# Patient Record
Sex: Female | Born: 1960 | Race: Black or African American | Hispanic: No | Marital: Married | Smoking: Current every day smoker
Health system: Southern US, Community
[De-identification: ages and names within clinical notes are randomized; demographics above are authoritative.]

## PROBLEM LIST (undated history)

## (undated) DIAGNOSIS — J449 Chronic obstructive pulmonary disease, unspecified: Secondary | ICD-10-CM

## (undated) DIAGNOSIS — I639 Cerebral infarction, unspecified: Secondary | ICD-10-CM

## (undated) DIAGNOSIS — E876 Hypokalemia: Secondary | ICD-10-CM

## (undated) DIAGNOSIS — I10 Essential (primary) hypertension: Secondary | ICD-10-CM

## (undated) HISTORY — PX: ABDOMINAL SURGERY: SHX537

## (undated) HISTORY — PX: TONSILLECTOMY: SUR1361

---

## 2011-03-23 ENCOUNTER — Emergency Department (INDEPENDENT_AMBULATORY_CARE_PROVIDER_SITE_OTHER): Payer: BC Managed Care – PPO

## 2011-03-23 ENCOUNTER — Encounter: Payer: Self-pay | Admitting: *Deleted

## 2011-03-23 ENCOUNTER — Emergency Department (HOSPITAL_BASED_OUTPATIENT_CLINIC_OR_DEPARTMENT_OTHER)
Admission: EM | Admit: 2011-03-23 | Discharge: 2011-03-24 | Disposition: A | Discharge status: Other Acute Inpatient Hospital | Payer: BC Managed Care – PPO | Source: Home / Self Care | Attending: Emergency Medicine | Admitting: Emergency Medicine

## 2011-03-23 ENCOUNTER — Other Ambulatory Visit: Payer: Self-pay

## 2011-03-23 DIAGNOSIS — J984 Other disorders of lung: Secondary | ICD-10-CM | POA: Insufficient documentation

## 2011-03-23 DIAGNOSIS — R21 Rash and other nonspecific skin eruption: Secondary | ICD-10-CM

## 2011-03-23 DIAGNOSIS — R1110 Vomiting, unspecified: Secondary | ICD-10-CM | POA: Insufficient documentation

## 2011-03-23 DIAGNOSIS — R911 Solitary pulmonary nodule: Secondary | ICD-10-CM

## 2011-03-23 DIAGNOSIS — E876 Hypokalemia: Secondary | ICD-10-CM

## 2011-03-23 DIAGNOSIS — K2970 Gastritis, unspecified, without bleeding: Secondary | ICD-10-CM | POA: Diagnosis present

## 2011-03-23 DIAGNOSIS — F17200 Nicotine dependence, unspecified, uncomplicated: Secondary | ICD-10-CM | POA: Diagnosis present

## 2011-03-23 DIAGNOSIS — R55 Syncope and collapse: Secondary | ICD-10-CM | POA: Insufficient documentation

## 2011-03-23 DIAGNOSIS — K922 Gastrointestinal hemorrhage, unspecified: Principal | ICD-10-CM | POA: Diagnosis present

## 2011-03-23 DIAGNOSIS — R197 Diarrhea, unspecified: Secondary | ICD-10-CM | POA: Insufficient documentation

## 2011-03-23 DIAGNOSIS — E8809 Other disorders of plasma-protein metabolism, not elsewhere classified: Secondary | ICD-10-CM | POA: Diagnosis present

## 2011-03-23 DIAGNOSIS — M542 Cervicalgia: Secondary | ICD-10-CM | POA: Insufficient documentation

## 2011-03-23 DIAGNOSIS — D62 Acute posthemorrhagic anemia: Secondary | ICD-10-CM | POA: Diagnosis present

## 2011-03-23 DIAGNOSIS — M79609 Pain in unspecified limb: Secondary | ICD-10-CM | POA: Insufficient documentation

## 2011-03-23 DIAGNOSIS — R209 Unspecified disturbances of skin sensation: Secondary | ICD-10-CM

## 2011-03-23 DIAGNOSIS — Z791 Long term (current) use of non-steroidal anti-inflammatories (NSAID): Secondary | ICD-10-CM

## 2011-03-23 DIAGNOSIS — R112 Nausea with vomiting, unspecified: Secondary | ICD-10-CM | POA: Diagnosis present

## 2011-03-23 DIAGNOSIS — R079 Chest pain, unspecified: Secondary | ICD-10-CM | POA: Diagnosis present

## 2011-03-23 HISTORY — DX: Hypokalemia: E87.6

## 2011-03-23 LAB — CBC
Platelets: 358 10*3/uL (ref 150–400)
RBC: 3.66 MIL/uL — ABNORMAL LOW (ref 3.87–5.11)
RDW: 20.2 % — ABNORMAL HIGH (ref 11.5–15.5)
WBC: 9.3 10*3/uL (ref 4.0–10.5)

## 2011-03-23 LAB — CARDIAC PANEL(CRET KIN+CKTOT+MB+TROPI)
CK, MB: 2.6 ng/mL (ref 0.3–4.0)
Relative Index: 2.1 (ref 0.0–2.5)
Troponin I: <0.3 ng/mL (ref <0.30)

## 2011-03-23 LAB — DIFFERENTIAL
Basophils Absolute: 0 10*3/uL (ref 0.0–0.1)
Lymphocytes Relative: 25 % (ref 12–46)
Lymphs Abs: 2.3 10*3/uL (ref 0.7–4.0)
Neutro Abs: 6.1 10*3/uL (ref 1.7–7.7)

## 2011-03-23 LAB — BASIC METABOLIC PANEL
CO2: 41 mEq/L (ref 19–32)
CO2: 40 mEq/L (ref 19–32)
Chloride: 85 mEq/L — ABNORMAL LOW (ref 96–112)
Chloride: 88 mEq/L — ABNORMAL LOW (ref 96–112)
Glucose, Bld: 115 mg/dL — ABNORMAL HIGH (ref 70–99)
Potassium: 1.9 mEq/L — CL (ref 3.5–5.1)
Potassium: 2 mEq/L — CL (ref 3.5–5.1)
Sodium: 137 mEq/L (ref 135–145)
Sodium: 138 mEq/L (ref 135–145)

## 2011-03-23 LAB — MAGNESIUM: Magnesium: 1.8 mg/dL (ref 1.5–2.5)

## 2011-03-23 LAB — POTASSIUM: Potassium: 2.1 mEq/L — CL (ref 3.5–5.1)

## 2011-03-23 MED ORDER — POTASSIUM CHLORIDE CRYS ER 20 MEQ PO TBCR
40.0000 meq | EXTENDED_RELEASE_TABLET | Freq: Once | ORAL | Status: AC
  Administered 2011-03-23: 40 meq via ORAL
  Filled 2011-03-23: qty 2

## 2011-03-23 MED ORDER — ONDANSETRON HCL 4 MG/2ML IJ SOLN
INTRAMUSCULAR | Status: AC
  Administered 2011-03-23: 4 mg via INTRAVENOUS
  Filled 2011-03-23: qty 2

## 2011-03-23 MED ORDER — ONDANSETRON HCL 4 MG/2ML IJ SOLN: 4.0000 mg | Freq: Once | INTRAMUSCULAR | Status: DC

## 2011-03-23 MED ORDER — ONDANSETRON HCL 4 MG/2ML IJ SOLN
4.0000 mg | Freq: Once | INTRAMUSCULAR | Status: AC
  Administered 2011-03-23: 4 mg via INTRAVENOUS

## 2011-03-23 MED ORDER — POTASSIUM CHLORIDE 10 MEQ/100ML IV SOLN
10.0000 meq | INTRAVENOUS | Status: AC
  Administered 2011-03-23 (×2): 10 meq via INTRAVENOUS
  Filled 2011-03-23 (×2): qty 100

## 2011-03-23 MED ORDER — SODIUM CHLORIDE 0.9 % IV BOLUS (SEPSIS)
1000.0000 mL | Freq: Once | INTRAVENOUS | Status: AC
  Administered 2011-03-23: 1000 mL via INTRAVENOUS

## 2011-03-23 NOTE — ED Notes (Signed)
Pt c/o rash under bil breast and bil legs pain x 3 weeks. Pt states she had a syncopal episode sat while standing up from chair.

## 2011-03-23 NOTE — ED Notes (Signed)
rec'd call from lab with critical lab results, PA made aware and new orders rec'd.

## 2011-03-23 NOTE — ED Notes (Signed)
Pt placed on bedpan per request.

## 2011-03-23 NOTE — ED Provider Notes (Addendum)
History     CSN: [id number] Arrival date & time: 03/23/2011  6:07 PM   Chief Complaint  Patient presents with  . Leg Pain  . Rash     (Include location/radiation/quality/duration/timing/severity/associated sxs/prior treatment) Patient is a 50 y.o. female presenting with leg pain, rash, and syncope. The history is provided by the patient. No language interpreter was used.  Leg Pain  The incident occurred more than 1 week ago. The incident occurred at home. There was no injury mechanism. The pain location is generalized. The quality of the pain is described as aching. The pain has been constant since onset. Pertinent negatives include no numbness, no muscle weakness, no loss of sensation and no tingling. She reports no foreign bodies present. The symptoms are aggravated by nothing.  Rash  This is a new problem. The current episode started more than 2 days ago. The problem has not changed since onset.The problem is associated with nothing. There has been no fever. The rash is present on the torso. The patient is experiencing no pain. The pain has been constant since onset. She has tried steriods for the symptoms. The treatment provided no relief.  Loss of Consciousness This is a new problem. The current episode started in the past 7 days. Episode frequency: 2 times 3 days ago. Associated symptoms include coughing, nausea, a rash and vomiting. Pertinent negatives include no change in bowel habit, fever, headaches or numbness. The symptoms are aggravated by standing. She has tried nothing for the symptoms.     Past Medical History  Diagnosis Date  . Hypokalemia      Past Surgical History  Procedure Date  . Abdominal surgery   . Tonsillectomy     History reviewed. No pertinent family history.  History  Substance Use Topics  . Smoking status: Current Everyday Smoker -- 1.0 packs/day  . Smokeless tobacco: Not on file  . Alcohol Use: No    OB History    Grav Para Term Preterm  Abortions TAB SAB Ect Mult Living                  Review of Systems  Constitutional: Negative for fever.  Respiratory: Positive for cough.   Cardiovascular: Positive for syncope.  Gastrointestinal: Positive for nausea and vomiting. Negative for change in bowel habit.  Skin: Positive for rash.  Neurological: Negative for tingling, numbness and headaches.  All other systems reviewed and are negative.    Allergies  Aspirin  Home Medications   Current Outpatient Rx  Name Route Sig Dispense Refill  . CETIRIZINE HCL 10 MG PO TABS Oral Take 10 mg by mouth daily as needed.      Marland Kitchen NAPROXEN SODIUM 220 MG PO TABS Oral Take 220 mg by mouth 2 (two) times daily with a meal. pain       Physical Exam    BP 117/88  Pulse 128  Temp 98.4 F (36.9 C)  Resp 18  Ht 5\' 10"  (1.778 m)  Wt 210 lb (95.255 kg)  BMI 30.13 kg/m2  SpO2 100%  Physical Exam  Nursing note and vitals reviewed. Constitutional: She is oriented to person, place, and time. She appears well-developed and well-nourished.  HENT:  Head: Atraumatic.  Eyes: Pupils are equal, round, and reactive to light.  Neck: Normal range of motion.  Cardiovascular: Normal rate and regular rhythm.   Pulmonary/Chest: Effort normal and breath sounds normal.  Abdominal: Soft. Bowel sounds are normal.  Musculoskeletal: Normal range of motion.  Neurological: She  is oriented to person, place, and time.  Skin:       Pt has redness under bilateral breast  Psychiatric: She has a normal mood and affect.    ED Course  CRITICAL CARE Performed by: Teressa Lower Authorized by: Pollyann Savoy Total critical care time: 30 minutes Critical care was necessary to treat or prevent imminent or life-threatening deterioration of the following conditions: cardiac failure. Critical care was time spent personally by me on the following activities: re-evaluation of patient's condition, examination of patient and discussions with  consultants.    Results for orders placed during the hospital encounter of 03/23/11  CBC      Component Value Range   WBC 9.3  4.0 - 10.5 (K/uL)   RBC 3.66 (*) 3.87 - 5.11 (MIL/uL)   Hemoglobin 11.1 (*) 12.0 - 15.0 (g/dL)   HCT 40.9 (*) 81.1 - 46.0 (%)   MCV 90.4  78.0 - 100.0 (fL)   MCH 30.3  26.0 - 34.0 (pg)   MCHC 33.5  30.0 - 36.0 (g/dL)   RDW 91.4 (*) 78.2 - 15.5 (%)   Platelets 358  150 - 400 (K/uL)  DIFFERENTIAL      Component Value Range   Neutrophils Relative 66  43 - 77 (%)   Neutro Abs 6.1  1.7 - 7.7 (K/uL)   Lymphocytes Relative 25  12 - 46 (%)   Lymphs Abs 2.3  0.7 - 4.0 (K/uL)   Monocytes Relative 9  3 - 12 (%)   Monocytes Absolute 0.8  0.1 - 1.0 (K/uL)   Eosinophils Relative 1  0 - 5 (%)   Eosinophils Absolute 0.1  0.0 - 0.7 (K/uL)   Basophils Relative 0  0 - 1 (%)   Basophils Absolute 0.0  0.0 - 0.1 (K/uL)  BASIC METABOLIC PANEL      Component Value Range   Sodium 137  135 - 145 (mEq/L)   Potassium 1.9 (*) 3.5 - 5.1 (mEq/L)   Chloride 85 (*) 96 - 112 (mEq/L)   CO2 41 (*) 19 - 32 (mEq/L)   Glucose, Bld 120 (*) 70 - 99 (mg/dL)   BUN 2 (*) 6 - 23 (mg/dL)   Creatinine, Ser 9.56  0.50 - 1.10 (mg/dL)   Calcium 8.8  8.4 - 21.3 (mg/dL)   GFR calc non Af Amer >60  >60 (mL/min)   GFR calc Af Amer >60  >60 (mL/min)  BASIC METABOLIC PANEL      Component Value Range   Sodium 138  135 - 145 (mEq/L)   Potassium 2.0 (*) 3.5 - 5.1 (mEq/L)   Chloride 88 (*) 96 - 112 (mEq/L)   CO2 40 (*) 19 - 32 (mEq/L)   Glucose, Bld 115 (*) 70 - 99 (mg/dL)   BUN 2 (*) 6 - 23 (mg/dL)   Creatinine, Ser <0.86 (*) 0.50 - 1.10 (mg/dL)   Calcium 8.1 (*) 8.4 - 10.5 (mg/dL)   GFR calc non Af Amer NOT CALCULATED  >60 (mL/min)   GFR calc Af Amer NOT CALCULATED  >60 (mL/min)  CARDIAC PANEL(CRET KIN+CKTOT+MB+TROPI)      Component Value Range   Total CK 126  7 - 177 (U/L)   CK, MB 2.6  0.3 - 4.0 (ng/mL)   Troponin I <0.30  <0.30 (ng/mL)   Relative Index 2.1  0.0 - 2.5    Dg Chest 2  View  03/23/2011  *RADIOLOGY REPORT*  Clinical Data: Neck pain and numbness.  CHEST - 2 VIEW  Comparison: None.  Findings: Right lung is clear.  Small 5 mm nodular densities seen in the left midlung.  This could be a confluence of shadows, but pulmonary nodule cannot be excluded. The cardiopericardial silhouette is within normal limits for size. Imaged bony structures of the thorax are intact.  IMPRESSION: Question 5 mm left lung nodule.  CT chest without contrast is recommended to further evaluate.  Original Report Authenticated By: ERIC A. MANSELL, M.D.     Date: 03/23/2011  Rate: 97  Rhythm: normal sinus rhythm  QRS Axis: normal  Intervals: normal  ST/T Wave abnormalities: nonspecific T wave changes  Conduction Disutrbances:none  Narrative Interpretation:   Old EKG Reviewed: none available   MDM Spoke with Dr. Kaylyn Layer and he is requesting 80 meq po of potassium and 2 runs and then a recheck prior to transport:pt is monitored at this time       Teressa Lower, NP 03/23/11 2129  Teressa Lower, NP 03/23/11 2133

## 2011-03-23 NOTE — ED Notes (Signed)
MD Bernette Mayers made aware of lab results, no new orders rec'd. SR on monitor, pt denies CP. Pt had episode of loose stool, peri-care provided, pt tolerated well.

## 2011-03-23 NOTE — ED Provider Notes (Signed)
Medical screening examination/treatment/procedure(s) were performed by non-physician practitioner and as supervising physician I was immediately available for consultation/collaboration.   Saliah Crisp B. Bernette Mayers, MD 03/23/11 2130

## 2011-03-23 NOTE — ED Notes (Signed)
Only one dose of zofran 4mg  was given at 2236.

## 2011-03-23 NOTE — ED Notes (Signed)
Pt c/o nauseea and vomiting, MD made aware, new orders rec'd

## 2011-03-23 NOTE — ED Provider Notes (Signed)
Medical screening examination/treatment/procedure(s) were conducted as a shared visit with non-physician practitioner(s) and myself.  I personally evaluated the patient during the encounter  Pt with several complaints, has had diarrhea, K is 2.0, HCO3 is 40. Given oral and IV Potassium, awaiting recheck to ensure improvement before admission. Resting comfortably, no dysrhythmias or EKG changes.   Charles B. Bernette Mayers, MD 03/23/11 2253

## 2011-03-23 NOTE — ED Notes (Signed)
Pt return from radiology, NAD noted, IV site unremarkable, IV fluids infusing without diff.

## 2011-03-23 NOTE — ED Notes (Signed)
SR on monitor, IV site unremarkable. Pt denies CP at this time, family at bs, plan of care discussed, SR up x2.

## 2011-03-24 ENCOUNTER — Inpatient Hospital Stay (HOSPITAL_COMMUNITY): Payer: BC Managed Care – PPO

## 2011-03-24 ENCOUNTER — Inpatient Hospital Stay (HOSPITAL_COMMUNITY)
Admission: AD | Admit: 2011-03-24 | Discharge: 2011-03-27 | DRG: 174 | Disposition: A | Discharge status: Home / Self Care | Payer: BC Managed Care – PPO | Source: Other Acute Inpatient Hospital | Attending: Internal Medicine | Admitting: Internal Medicine

## 2011-03-24 DIAGNOSIS — M79609 Pain in unspecified limb: Secondary | ICD-10-CM

## 2011-03-24 LAB — TSH: TSH: 2.041 u[IU]/mL (ref 0.350–4.500)

## 2011-03-24 LAB — HEPATIC FUNCTION PANEL
Alkaline Phosphatase: 127 U/L — ABNORMAL HIGH (ref 39–117)
Indirect Bilirubin: 0.4 mg/dL (ref 0.3–0.9)
Total Bilirubin: 0.8 mg/dL (ref 0.3–1.2)

## 2011-03-24 LAB — LIPID PANEL
HDL: 34 mg/dL — ABNORMAL LOW (ref >39)
Total CHOL/HDL Ratio: 2.9 RATIO

## 2011-03-24 LAB — FOLATE: Folate: 3.8 ng/mL

## 2011-03-24 LAB — CARDIAC PANEL(CRET KIN+CKTOT+MB+TROPI)
CK, MB: 3.6 ng/mL (ref 0.3–4.0)
Relative Index: 2.1 (ref 0.0–2.5)
Relative Index: 2 (ref 0.0–2.5)
Total CK: 166 U/L (ref 7–177)
Total CK: 184 U/L — ABNORMAL HIGH (ref 7–177)
Troponin I: <0.3 ng/mL (ref <0.30)

## 2011-03-24 LAB — BASIC METABOLIC PANEL
Calcium: 7.5 mg/dL — ABNORMAL LOW (ref 8.4–10.5)
Glucose, Bld: 97 mg/dL (ref 70–99)
Sodium: 137 mEq/L (ref 135–145)

## 2011-03-24 LAB — CBC
HCT: 28.5 % — ABNORMAL LOW (ref 36.0–46.0)
Hemoglobin: 9.4 g/dL — ABNORMAL LOW (ref 12.0–15.0)
MCH: 29.7 pg (ref 26.0–34.0)
MCHC: 33 g/dL (ref 30.0–36.0)
MCV: 89.9 fL (ref 78.0–100.0)

## 2011-03-24 LAB — HEMOGLOBIN A1C: Mean Plasma Glucose: 103 mg/dL (ref <117)

## 2011-03-24 LAB — MAGNESIUM: Magnesium: 1.9 mg/dL (ref 1.5–2.5)

## 2011-03-24 LAB — CLOSTRIDIUM DIFFICILE BY PCR: Toxigenic C. Difficile by PCR: NEGATIVE

## 2011-03-24 LAB — DIFFERENTIAL
Basophils Absolute: 0 10*3/uL (ref 0.0–0.1)
Eosinophils Relative: 1 % (ref 0–5)
Lymphocytes Relative: 28 % (ref 12–46)
Monocytes Absolute: 0.7 10*3/uL (ref 0.1–1.0)

## 2011-03-24 LAB — IRON AND TIBC: UIBC: 149 ug/dL (ref 125–400)

## 2011-03-24 LAB — LIPASE, BLOOD: Lipase: 11 U/L (ref 11–59)

## 2011-03-24 MED ORDER — IOHEXOL 300 MG/ML  SOLN
100.0000 mL | Freq: Once | INTRAMUSCULAR | Status: AC | PRN
  Administered 2011-03-24: 100 mL via INTRAVENOUS

## 2011-03-24 MED ORDER — FENTANYL CITRATE 0.05 MG/ML IJ SOLN
INTRAMUSCULAR | Status: AC
  Administered 2011-03-24: 02:00:00 via INTRAVENOUS
  Filled 2011-03-24: qty 2

## 2011-03-24 NOTE — ED Notes (Signed)
Pt report given to Jason, RN with [facility]. 

## 2011-03-24 NOTE — ED Notes (Signed)
Pt report called and given to Inetta Fermo, RN on 2600.

## 2011-03-24 NOTE — ED Notes (Signed)
Pt placed on bedpan per pt request.

## 2011-03-24 NOTE — ED Notes (Signed)
Pt updated on plan of care and the wait for inpt bed. No new complaints at this time.

## 2011-03-25 ENCOUNTER — Inpatient Hospital Stay (HOSPITAL_COMMUNITY): Payer: BC Managed Care – PPO

## 2011-03-25 ENCOUNTER — Other Ambulatory Visit: Payer: Self-pay | Admitting: Gastroenterology

## 2011-03-25 DIAGNOSIS — M79609 Pain in unspecified limb: Secondary | ICD-10-CM

## 2011-03-25 LAB — BASIC METABOLIC PANEL
Calcium: 7.6 mg/dL — ABNORMAL LOW (ref 8.4–10.5)
Chloride: 105 mEq/L (ref 96–112)
Creatinine, Ser: <0.47 mg/dL — ABNORMAL LOW (ref 0.50–1.10)

## 2011-03-25 LAB — URINALYSIS, ROUTINE W REFLEX MICROSCOPIC
Nitrite: POSITIVE — AB
Specific Gravity, Urine: 1.018 (ref 1.005–1.030)
pH: 8 (ref 5.0–8.0)

## 2011-03-25 LAB — URINE MICROSCOPIC-ADD ON

## 2011-03-25 LAB — GLUCOSE, CAPILLARY: Glucose-Capillary: 117 mg/dL — ABNORMAL HIGH (ref 70–99)

## 2011-03-25 LAB — CBC
MCHC: 32.7 g/dL (ref 30.0–36.0)
Platelets: 307 10*3/uL (ref 150–400)
RDW: 20.6 % — ABNORMAL HIGH (ref 11.5–15.5)
WBC: 7.8 10*3/uL (ref 4.0–10.5)

## 2011-03-25 LAB — OCCULT BLOOD X 1 CARD TO LAB, STOOL: Fecal Occult Bld: POSITIVE

## 2011-03-26 DIAGNOSIS — K2901 Acute gastritis with bleeding: Secondary | ICD-10-CM

## 2011-03-26 LAB — COMPREHENSIVE METABOLIC PANEL
ALT: 16 U/L (ref 0–35)
CO2: 27 mEq/L (ref 19–32)
Calcium: 7.9 mg/dL — ABNORMAL LOW (ref 8.4–10.5)
Chloride: 110 mEq/L (ref 96–112)
GFR calc Af Amer: >60 mL/min (ref >60)
GFR calc non Af Amer: >60 mL/min (ref >60)
Glucose, Bld: 82 mg/dL (ref 70–99)
Sodium: 143 mEq/L (ref 135–145)
Total Bilirubin: 0.7 mg/dL (ref 0.3–1.2)

## 2011-03-26 LAB — RAPID URINE DRUG SCREEN, HOSP PERFORMED
Barbiturates: NOT DETECTED
Cocaine: NOT DETECTED
Tetrahydrocannabinol: POSITIVE — AB

## 2011-03-26 LAB — CBC
Hemoglobin: 8.4 g/dL — ABNORMAL LOW (ref 12.0–15.0)
MCH: 30 pg (ref 26.0–34.0)
MCV: 93.9 fL (ref 78.0–100.0)
RBC: 2.8 MIL/uL — ABNORMAL LOW (ref 3.87–5.11)
WBC: 7.6 10*3/uL (ref 4.0–10.5)

## 2011-03-26 LAB — URINE CULTURE
Colony Count: 15000
Culture  Setup Time: 201209211200

## 2011-03-26 MED ORDER — IOHEXOL 300 MG/ML  SOLN
100.0000 mL | Freq: Once | INTRAMUSCULAR | Status: AC | PRN
  Administered 2011-03-26: 100 mL via INTRAVENOUS

## 2011-03-27 LAB — CBC
HCT: 26.1 % — ABNORMAL LOW (ref 36.0–46.0)
MCH: 30.2 pg (ref 26.0–34.0)
MCHC: 32.6 g/dL (ref 30.0–36.0)
MCV: 92.9 fL (ref 78.0–100.0)
Platelets: 350 10*3/uL (ref 150–400)
RDW: 20.5 % — ABNORMAL HIGH (ref 11.5–15.5)

## 2011-03-28 LAB — STOOL CULTURE

## 2011-03-28 NOTE — Discharge Summary (Signed)
NAMESUMEYA, YONTZ NO.:  [id number]  MEDICAL RECORD NO.:  [id number]  LOCATION:  5508                         FACILITY:  [facility]  PHYSICIAN:  Andreas Blower, MD       DATE OF BIRTH:  Apr 18, 1961  DATE OF ADMISSION:  03/24/2011 DATE OF DISCHARGE:  03/27/2011                              DISCHARGE SUMMARY   PRIMARY CARE PHYSICIAN:  The patient does not have one.  DISCHARGE DIAGNOSES: 1. Nausea, vomiting, diarrhea likely due to gastritis and gastric     erosions. 2. Anemia from acute blood loss anemia due to gastrointestinal bleed. 3. Gastritis and gastric erosions, on PPI. 4. Syncope. 5. Hypokalemia. 6. Hypoalbuminemia. 7. Urine drug screen positive for THC. 8. Tobacco use.  DISCHARGE MEDICATIONS: 1. Nu-Iron 150 mg p.o. twice daily for 30 days, then once daily     thereafter. 2. Omeprazole 20 mg p.o. b.i.d. for 1 month, then once daily     thereafter. 3. Cetirizine 10 mg p.o. daily as needed for allergies.  The patient     was instructed to stop taking naproxen.  BRIEF ADMITTING HISTORY AND PHYSICAL:  Ms. Ruberg is a 50 year old African American female with no significant past medical history with some lower extremity pain over the last few weeks presented with pleuritic chest pain with syncope.  RADIOLOGY/IMAGING: 1. The patient had a chest x-ray two-view, which showed question 5 mm     left lung nodule. 2. The patient had a CT of the head without contrast, which showed     mild volume loss for age.  No intracranial abnormality. 3. The patient had CT angio of chest with contrast, which shows no     main or lobar branch pulmonary embolism.  Suboptimal evaluation of     segmental and subsegmental branches.  Coronary artery     calcification.  Hepatic steatosis noted.  Respiratory motion     depredates detailed parenchymal evaluation.  Bibasilar atelectasis,     otherwise without focal consolidation. 4. The patient had CT of the abdomen and pelvis with  contrast, which     shows no acute abdominal or pelvic findings.  Large ventral hernia     contains long segment of nonobstructive bowel.  Hepatic steatosis.     Small bilateral pleural effusions.  LABORATORY DATA:  CBC shows white count of 7.7, hemoglobin 8.5.  Initial hemoglobin on presentation 11.1, hematocrit 26.1, platelet count 350. Electrolytes normal with a BUN of less than 3.0, creatinine 0.47.  Liver function tests normal with an albumin of 1.7.  Initial potassium on presentation was 2.1.  Troponins negative x4.  Hemoglobin A1C 5.2.  LDL 52.  Serum iron 40, TIBC 189, B12 of 1780, ferritin 19.  Urine drug screen positive for benzodiazepine and THC.  UA is positive for nitrites and trace leukocytes.  Urine culture shows 15,000 colonies of multiple bacteria.  Fecal occult positive x2.  MRSA PCR negative.  C diff PCR negative.  Giardia screen negative.  Cryptosporidium negative.  HOSPITAL COURSE: 1. Syncope.  The patient was admitted and was ruled out for acute     coronary syndrome.  The patient had a 2-D echocardiogram, with  results per Cardiology was normal echocardiogram. 2. Nausea, vomiting, diarrhea, etiology unclear, suspect may be due to     gastritis. The patient was started on clear liquid diet and     diet was advanced as tolerated. Prior to discharge was     tolerating good PO intake. 3. Anemia likely due to acute blood loss from GI bleed.  The patient     was started on b.i.d. PPI.  The patient did not require any blood     transfusion during the course of the hospital stay.  Hemoglobin     stable during the course of the hospital stay. 4. Gastritis/gastric erosions.  The patient had EGD on March 25, 2011, by Dr. Elnoria Howard.  The patient was initially placed on PPI, which     will be continued.  Dr. Elnoria Howard performed biopsies with results that     are pending. 5. Severe hypokalemia.  The patient was aggressively replaced during     the course of hospital stay.   On March 26, 2011, potassium was     4.5. 6. Hypoalbuminemia likely due to poor p.o. intake.  The patient was     encouraged good p.o. intake prior to discharge. 7. Urine drug screen positive for THC.  Encouraged cessation. 8. Tobacco use.  Had an extensive discussion with the patient, who     verbalized cessation. 9. Questionable 5 mm left lung nodule noted on chest x-ray.  The     patient had CT angiogram of chest, which did not show any clear     etiology for this pulmonary nodule noted on chest x-ray.  DISPOSITION AND FOLLOWUP:  Case management will arrange for primary care physician with the patient prior to discharge.  Time spent on discharge talking to the patient, consultants, and coordinating care was 35 minutes.   Andreas Blower, MD   SR/MEDQ  D:  03/27/2011  T:  03/27/2011  Job:  [id number]  Electronically Signed by Wardell Heath Zandrea Kenealy  on 03/28/2011 06:21:39 PM

## 2011-04-03 NOTE — H&P (Signed)
Karla Hernandez, CRASS NO.:  [id number]  MEDICAL RECORD NO.:  [id number]  LOCATION:  2605                         FACILITY:  [facility]  PHYSICIAN:  Eduard Clos, MDDATE OF BIRTH:  1961/03/07  DATE OF ADMISSION:  03/24/2011 DATE OF DISCHARGE:                             HISTORY & PHYSICAL   PRIMARY CARE PHYSICIAN:  Unassigned.  CHIEF COMPLAINT:  Lower extremity pain, syncope and nausea, vomiting, and diarrhea.  HISTORY OF PRESENT ILLNESS:  A 50 year old female with no significant past medical history, has been experiencing some lower extremity pain over the last weeks.  In addition, the patient has also been having some pleuritic-type chest pain whenever she takes a deep breath.  The patient states that her lower extremity has been all the time, it is like burning in sensation, knee downwards no swelling, has no relation to exertion.  She did not fall though she had lost consciousness twice over the last 1 week, the last one on Saturday.  It was a brief episode.  She was feeling weak.  Over the last few days, nausea, vomiting, and diarrhea.  At least each time, it was 3-4 times a day with diarrhea and nausea and vomiting.  Denies any blood in it and over the last 2 weeks, she has been using Aleve off and on for the lower extremity pain which did not give any much relief.  She came into the ER.  In the ER, the patient was found to be severely hypokalemic with potassium of 1.9 and it was repeated twice and the last one was 2.1 around the midnight.  She has been given 80 of potassium orally now, she did throw up once in the ER and she has been replaced IV potassium also.  The patient has been admitted for further workup.  The patient states that chest pain is like pleuritic and has been going off and on for last 2 weeks, she takes a deep breath.  Denies shortness of breath though she feels intensely weak.  She has been having nausea, vomiting, and diarrhea  as explained earlier and denies any abdominal pain.  She has no headache, though she has lost consciousness twice.  No focal deficit.  Denies any incontinence of urine or seizure-like activities.  Denies any tongue bite.  Denies any fever or chills.  PAST MEDICAL HISTORY:  Nothing significant.  PAST SURGICAL HISTORY:  History of surgery for stomach from ulcer for which she took some aspirin.  SOCIAL HISTORY:  The patient smokes cigarettes, drinks alcohol once or twice a week.  Denies any drug abuse.  Married.  FAMILY HISTORY:  Positive for coronary artery disease.  ALLERGIES:  ASPIRIN.  REVIEW OF SYSTEMS:  As per history of present illness, nothing else significant.  PHYSICAL EXAMINATION:  GENERAL:  Patient examined at bedside not in acute distress. VITAL SIGNS:  Blood pressure is 117/88, pulse is 80 per minute, temperature 98.4, respirations 18 per minute, O2 sat is 100%.  HEENT: Anicteric.  No pallor.  No facial asymmetry.  PERRLA positive.  Tongue is midline. CHEST:  Bilateral air entry present.  No rhonchi, no crepitation. HEART:  S1 and S2 heard. ABDOMEN:  Soft,  nontender.  Bowel sounds heard.  There is some ventral hernia unobstructed. CNS:  Patient alert, awake, and oriented to time, place, and person. Moves upper and lower extremities 5/5. EXTREMITIES:  Peripheral pulses felt.  No edema.  LABS:  EKG shows normal sinus rhythm with heart rate around 97 beats per minute, QTc is 558 milliseconds.  CBC, WBC 9.3, hemoglobin is 11.1, hematocrit is 33.1, platelets 358.  Basic metabolic panel, sodium 138, potassium 2.1, on admission it was 1.9, chloride 88, carbon dioxide 40, glucose 115, BUN 2, creatinine less than 0.4, calcium 8.1, magnesium 1.8, CK is 126, MB is 2.6, relative index 2.1, troponin less than 0.3. Chest x-ray shows questionable 5-mm left lung nodule.  CT chest without contrast is recommended for further evaluation.  ASSESSMENT: 1. Severe hypokalemia. 2.  Pleuritic-type of chest pain. 3. Nausea, vomiting, and diarrhea. 4. Syncopal episodes. 5. Lower extremity pain. 6. Tobacco abuse. 7. Lung nodule. 8. Normocytic normochromic anemia.  PLAN: 1. At this time, patient is in step down due to severe hypokalemia. 2. For her hypokalemia, the patient will be getting replacement of     potassium.  I am going to give another additional dose of 80 p.o.     with KCl IV recheck in another 2 hours of potassium levels.  We     will also check mag level at the same time.  I do not know the     exact cause of hypokalemia, it could be from her recent nausea,     vomiting, and diarrhea. 3. Nausea, vomiting, and diarrhea.  We will check stool studies     including C. diff PCR, ova, parasite and culture and sensitivity.     We will also get a CT of abdomen and pelvis without contrast as     this patient does have some ventral hernia. 4. Pleuritic-type of chest pain.  We will cycle cardiac markers, I am     going to get a CT angio chest as patient does have also lung nodule     at the same time to rule out any pulmonary embolism or any other     lung causes. 5. Lower extremity pain.  This is burning in sensation. At this time,     I am also going to check anemia panel particularly to look for B12     and folate levels.  We will check TSH and get ABI and Dopplers of     lower extremity. 6. Syncopal episode.  We will get a CT head, get a 2-D echo.  The     patient's QTc is quite prolonged, we will recheck her EKG after her     potassium is corrected. 7. In addition, I am going to check LFTs and lipase and further     recommendation as condition evolves.     Eduard Clos, MD     ANK/MEDQ  D:  03/24/2011  T:  03/24/2011  Job:  [id number]  Electronically Signed by Midge Minium MD on 04/03/2011 12:00:27 PM

## 2011-04-08 NOTE — Consult Note (Signed)
  Karla Hernandez, WHITEHORN NO.:  [id number]  MEDICAL RECORD NO.:  [id number]  LOCATION:  5508                         FACILITY:  [facility]  PHYSICIAN:  Jordan Hawks. Elnoria Howard, MD    DATE OF BIRTH:  Jan 22, 1961  DATE OF CONSULTATION:  03/25/2011 DATE OF DISCHARGE:                                CONSULTATION   REASON FOR CONSULTATION:  Nausea, vomiting, melena, and epigastric pain.  This is a unassigned [facility] patient.  HISTORY OF PRESENT ILLNESS:  This is a 50 year old female with a past medical history of gastric ulcer approximately 2 years ago, and arthritis.  She was admitted to the hospital with complaints of knee pain.  The patient has been taking Advil and Aleve every other day for approximately one to two tablets every other day for several weeks because of her arthritis type of pain.  However, within the past 2 weeks, she reports having melena and she does have some epigastric type of discomfort.  As a result, she also developed nausea and vomiting. She presented in the emergency room.  She was noted to be anemic where the hemoglobin dropped from 11.1 and 9.4.  She denies any odynophagia. As a result of her symptoms, a GI consultation was requested for further evaluation and treatment.  PAST MEDICAL HISTORY AND PAST SURGICAL HISTORY:  As stated above.  FAMILY HISTORY:  Noncontributory.  SOCIAL HISTORY:  Do smoke or drink.  Positive for tobacco and social alcohol.  No illicit drug use.  REVIEW OF SYSTEMS:  As stated above in history of present illness, otherwise negative.  PHYSICAL EXAMINATION:  VITAL SIGNS:  Blood pressure is 106/71, heart rate is 83, pulse ox is 100% on 2 liters nasal cannula, respirations 19. GENERAL:  The patient is in no acute distress, but she is in discomfort when she moves. HEENT:  Normocephalic, atraumatic.  Extraocular muscles intact. NECK:  Supple.  No lymphadenopathy. LUNGS:  Clear to auscultation  bilaterally. CARDIOVASCULAR:  Regular rate and rhythm. ABDOMEN:  Obese, soft, tender in the epigastric region.  No rebound or rigidity.  Positive bowel sounds. EXTREMITIES:  No clubbing, cyanosis, or edema.  LABORATORY VALUES:  Hemoglobin 9.4.  Sodium is 142, potassium 3.4, chloride 105, CO2 32, BUN is less than 3, creatinine 0.4, and glucose is 95.  IMPRESSION: 1. Melena. 2. Heme-positive stool. 3. Nausea and vomiting. 4. Nonsteroidal anti-inflammatory drug use.  The patient does have a     recent history of nonsteroidal anti-inflammatory drug use and she     may have nonsteroidal anti-inflammatory drug-induced ulcerations,     which is the cause of her anemia and also her nausea and vomiting     type of symptoms.  Further evaluation with the EGD is required at     this time.  PLAN:  EGD down and further recommendations will be made pending the findings.     Jordan Hawks Elnoria Howard, MD     PDH/MEDQ  D:  03/25/2011  T:  03/26/2011  Job:  [id number]  Electronically Signed by Jeani Hawking MD on 04/08/2011 08:14:59 AM

## 2013-06-15 IMAGING — CR DG CHEST 2V
2 series · 2 of 2 positions shown · non-contrast
Comparison: None.

CLINICAL DATA: Neck pain and numbness.

CHEST - 2 VIEW

[w chest pa]
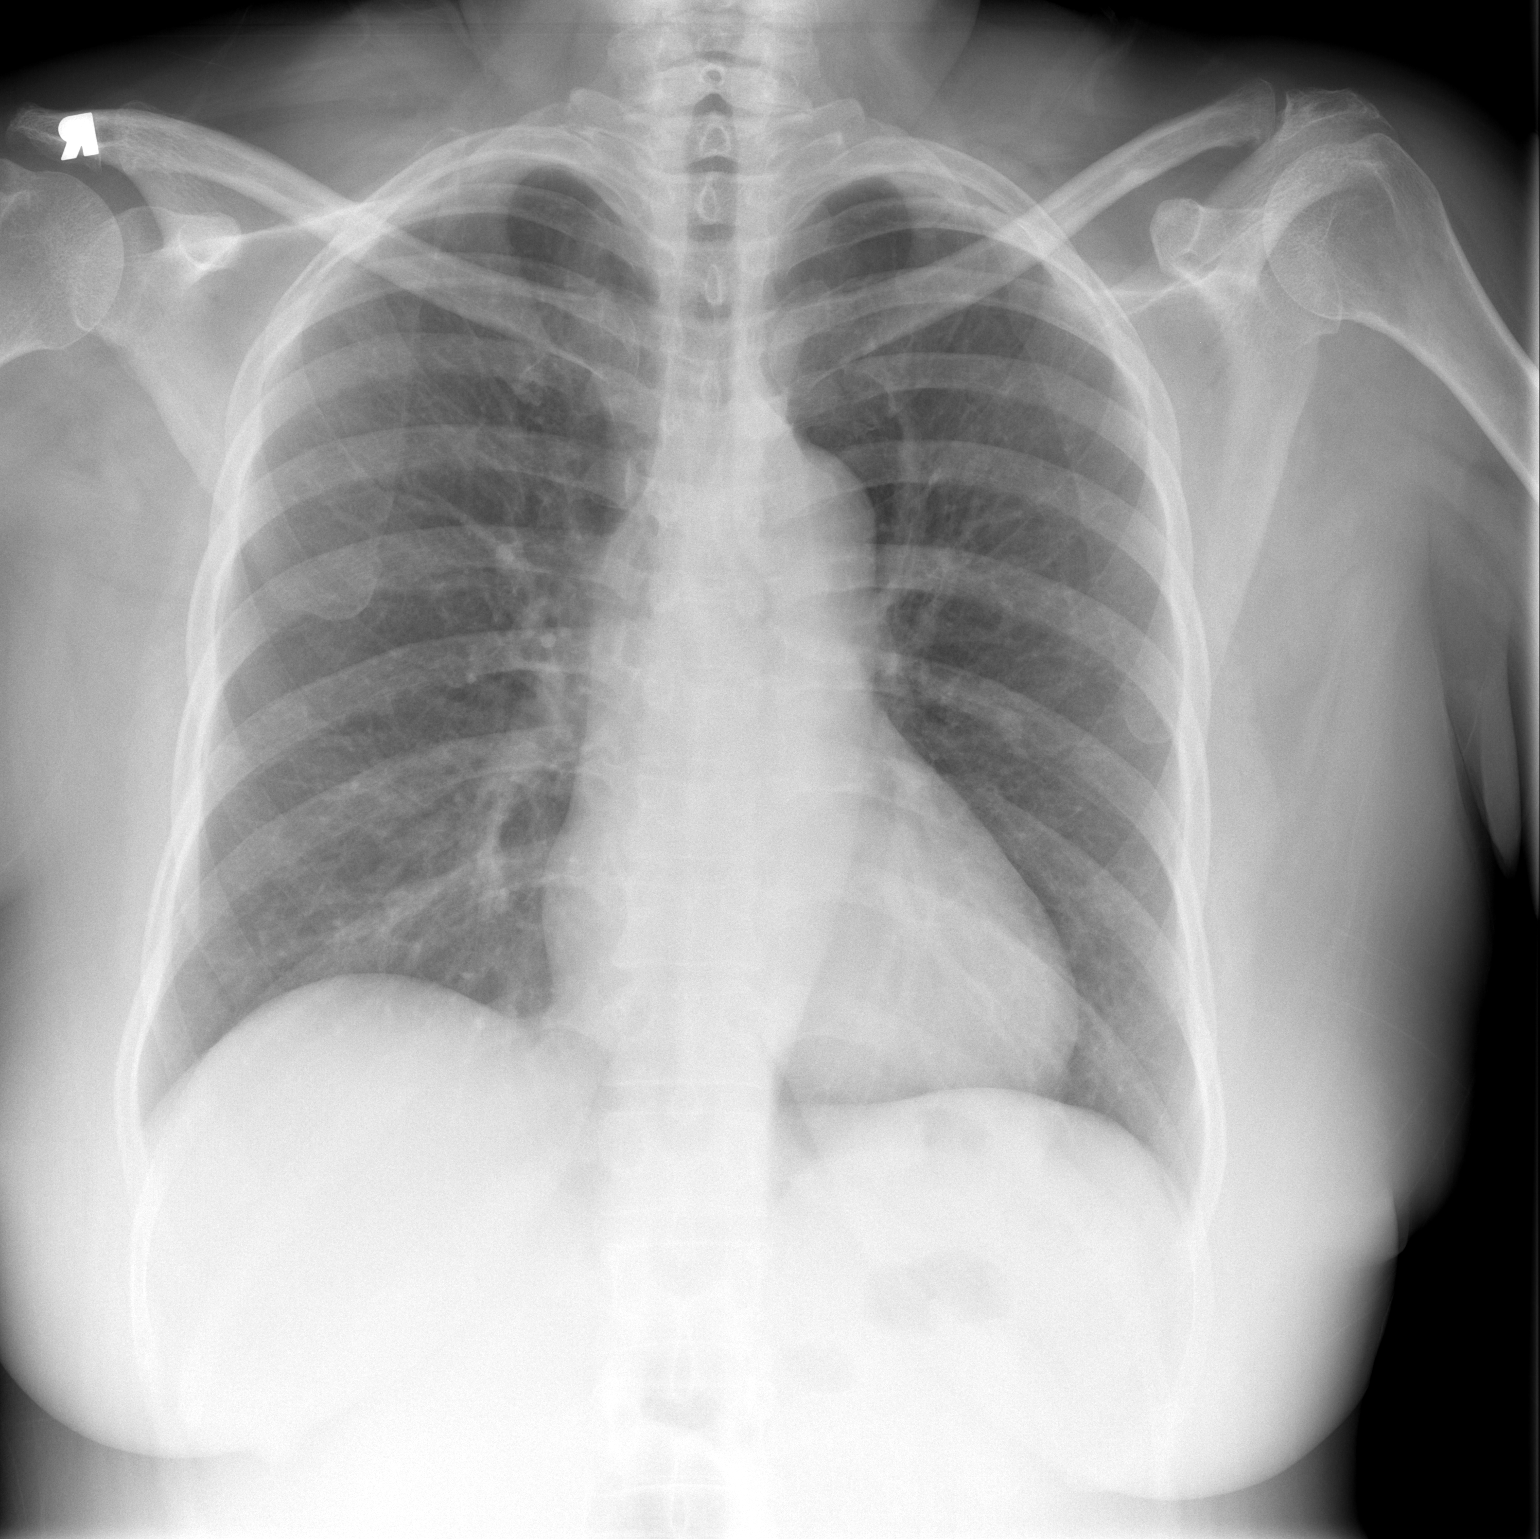

[w chest lat]
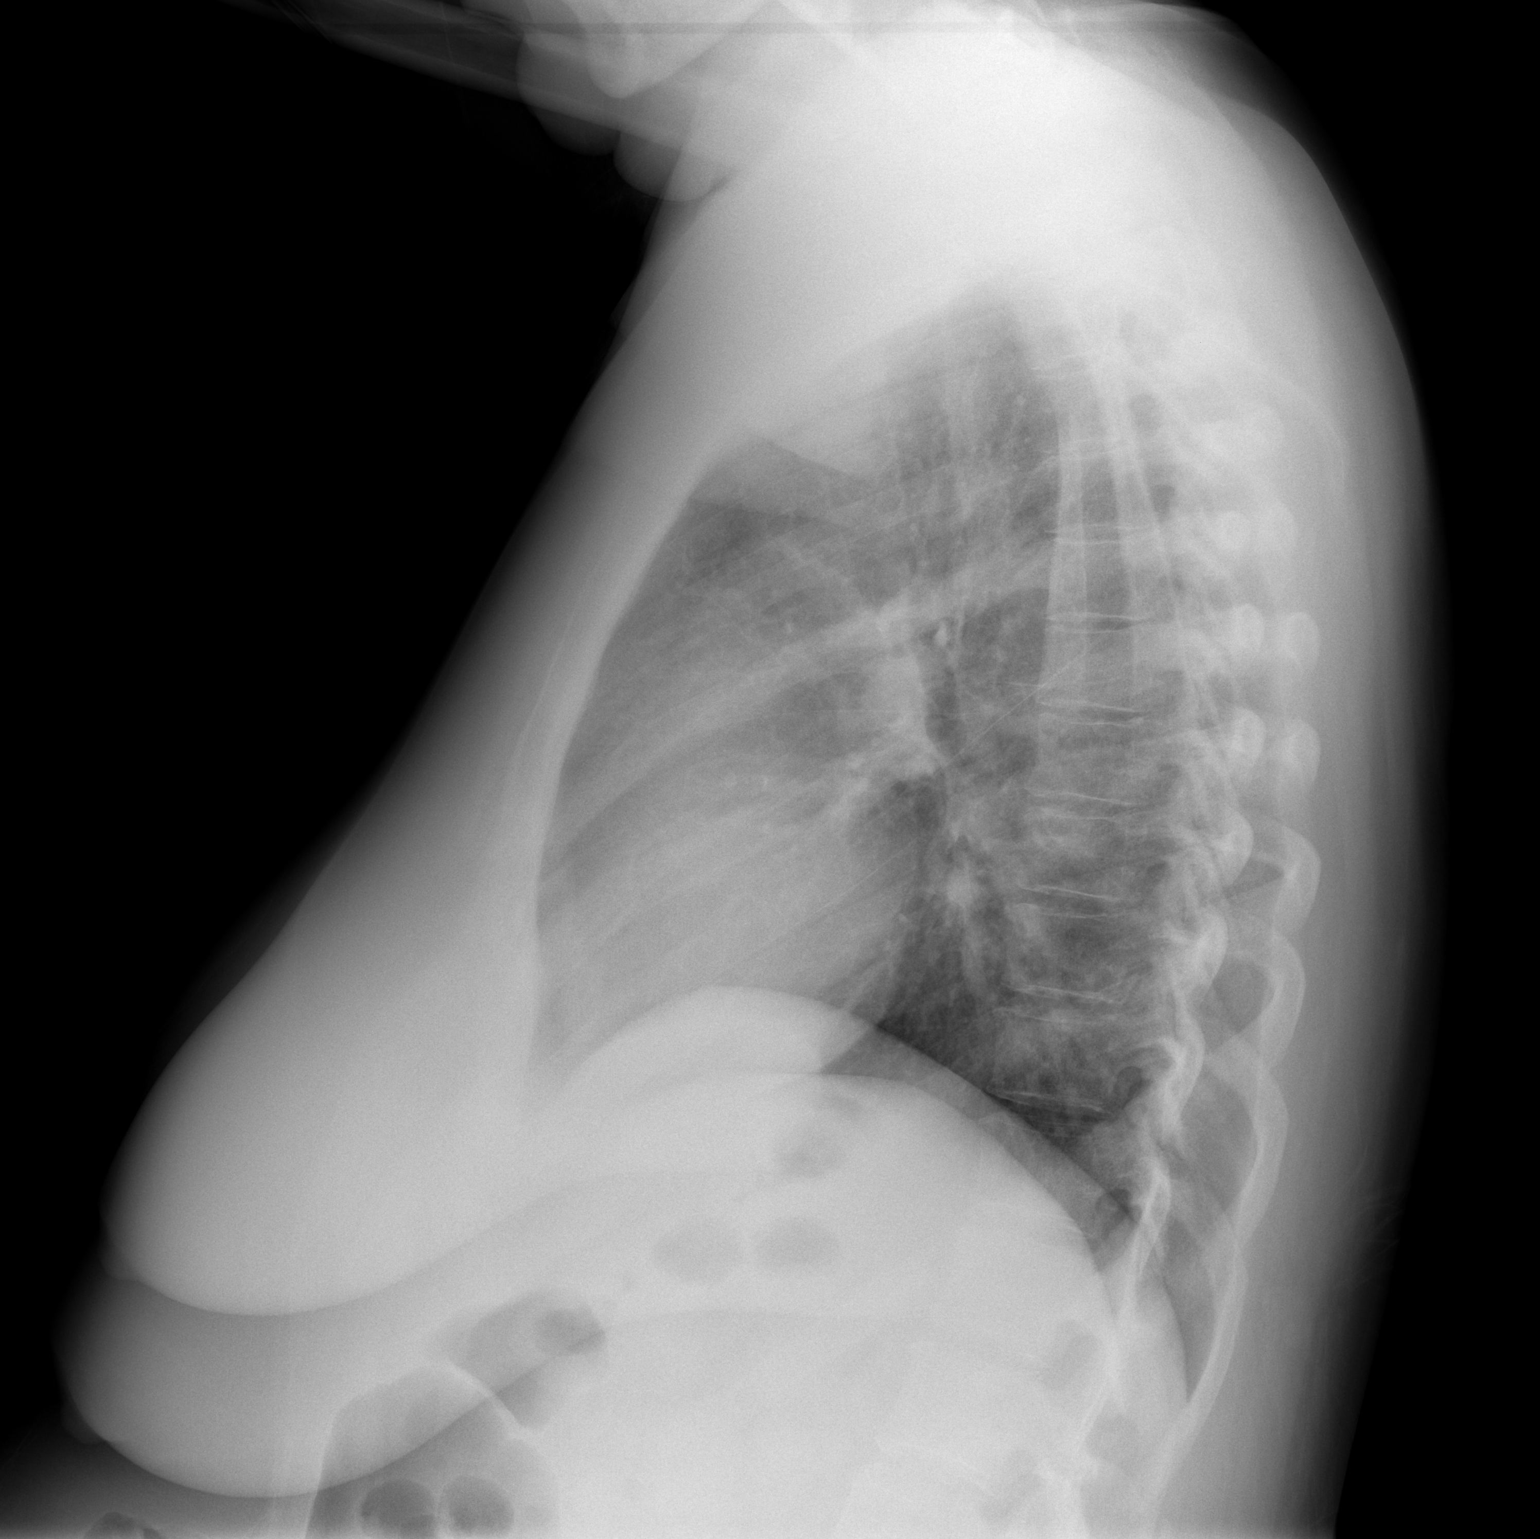

[2 of 2 positions shown; findings below may reference images not displayed]

FINDINGS: Right lung is clear.  Small 5 mm nodular densities seen
in the left midlung.  This could be a confluence of shadows, but
pulmonary nodule cannot be excluded. The cardiopericardial
silhouette is within normal limits for size. Imaged bony structures
of the thorax are intact.
IMPRESSION: Question 5 mm left lung nodule.  CT chest without contrast is
recommended to further evaluate.

## 2013-06-16 IMAGING — CT CT HEAD W/O CM
1 series · 16 of 30 positions shown, 20 images · non-contrast
Comparison: None.

CLINICAL DATA: Altered mental status

CT HEAD WITHOUT CONTRAST
TECHNIQUE: Contiguous axial images were obtained from the base of
the skull through the vertex without contrast.

[Series 2: head routine 4.8 h37s · axial · 0.45mm/px · z∈[+108,+263]mm · 16 of 36 slices shown, 20 images]
[im 2/36  brain]
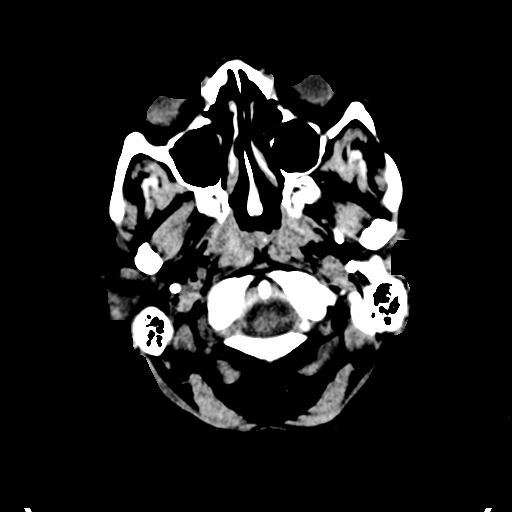
[im 2/36  bone]
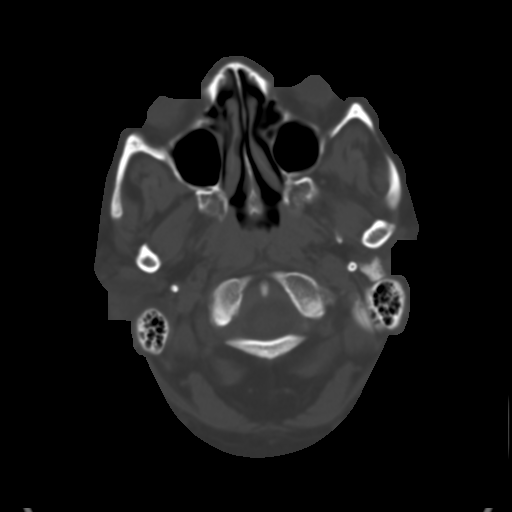
[im 4/36  brain]
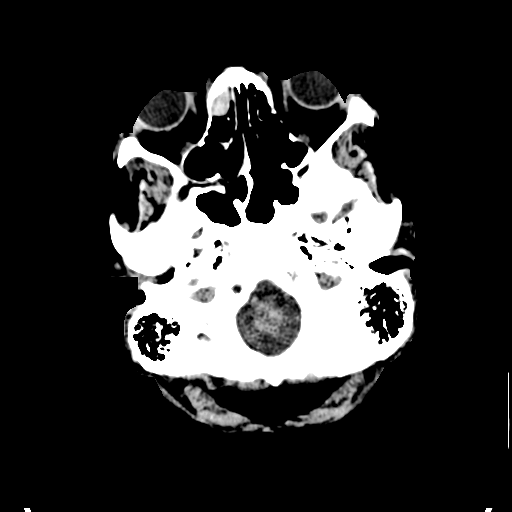
[im 7/36  brain]
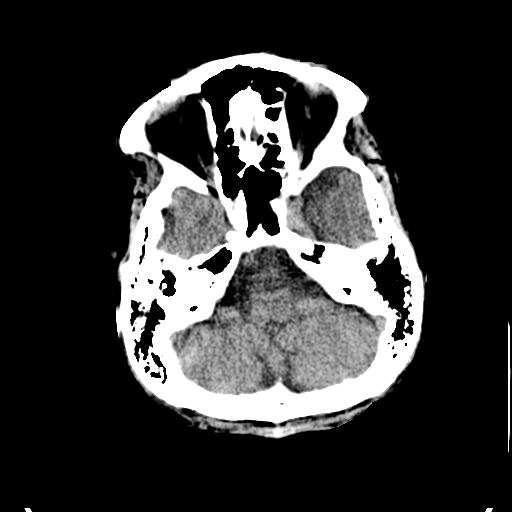
[im 9/36  brain]
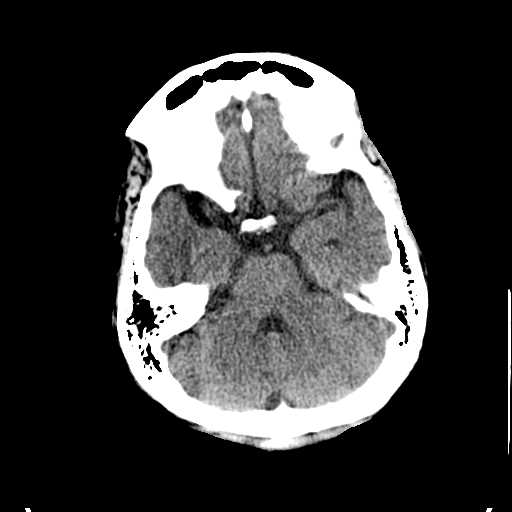
[im 10/36  brain]
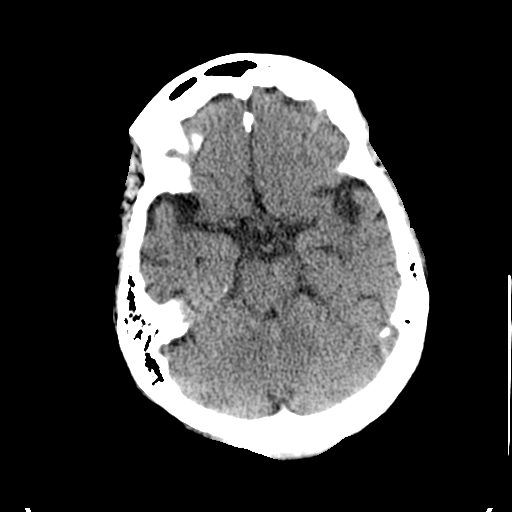
[im 10/36  bone]
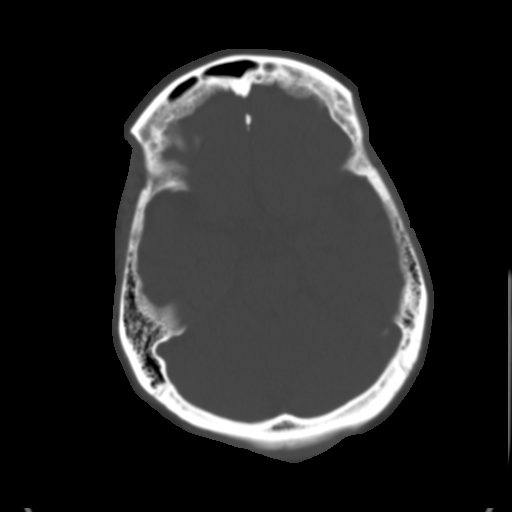
[im 13/36  brain]
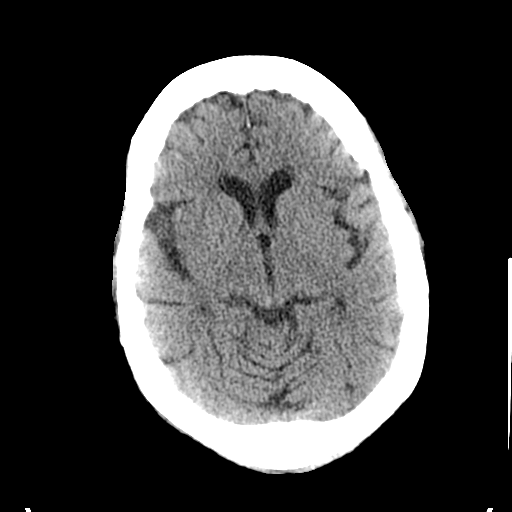
[im 15/36  brain]
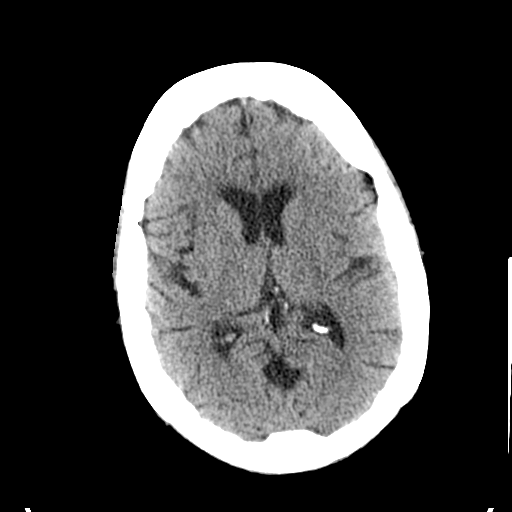
[im 17/36  brain]
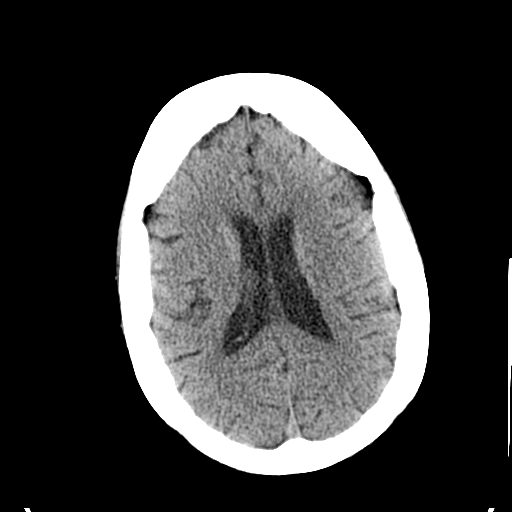
[im 19/36  brain]
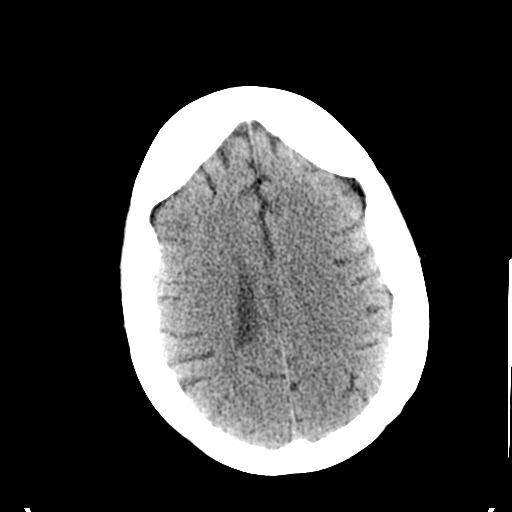
[im 19/36  bone]
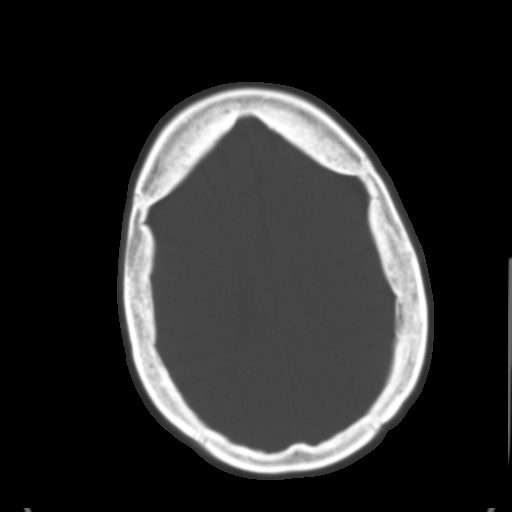
[im 21/36  brain]
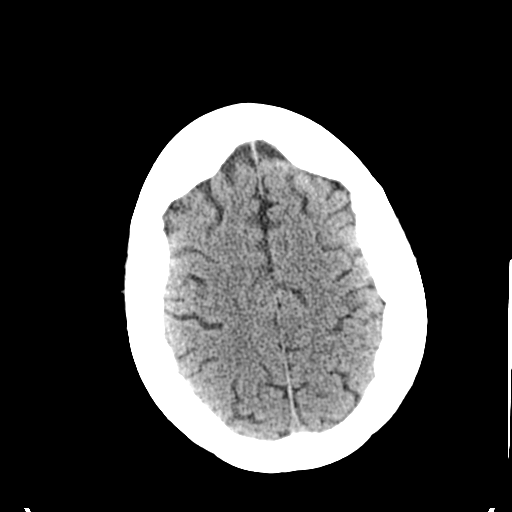
[im 23/36  brain]
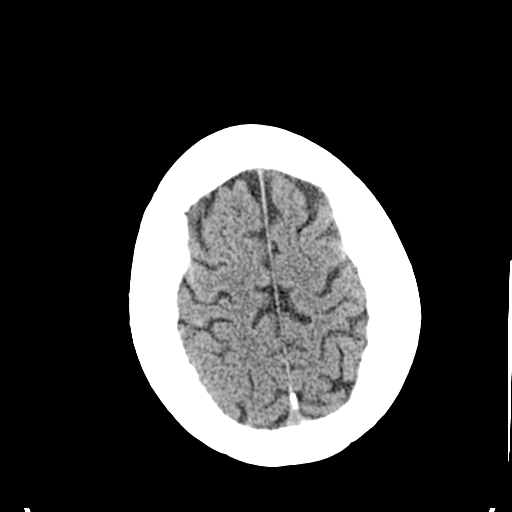
[im 26/36  brain]
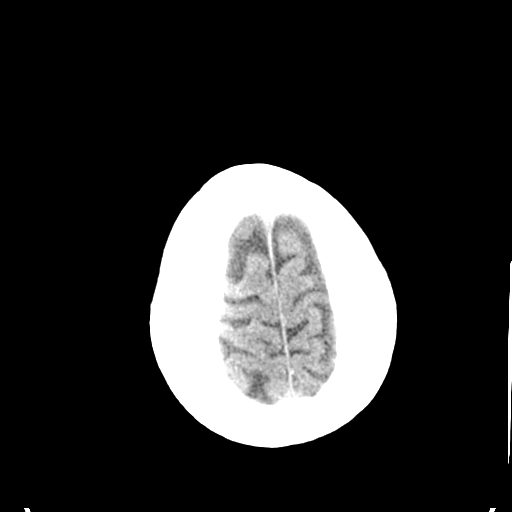
[im 27/36  brain]
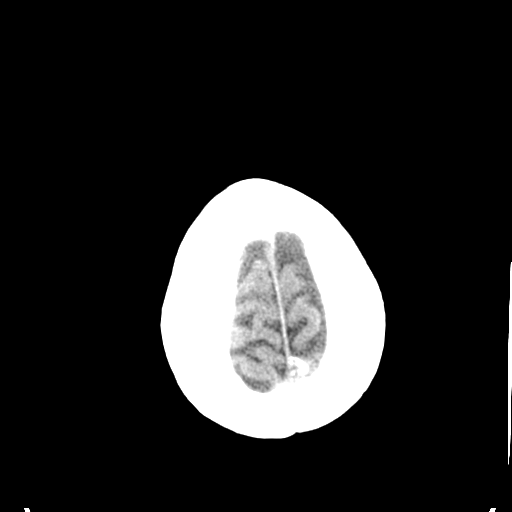
[im 27/36  bone]
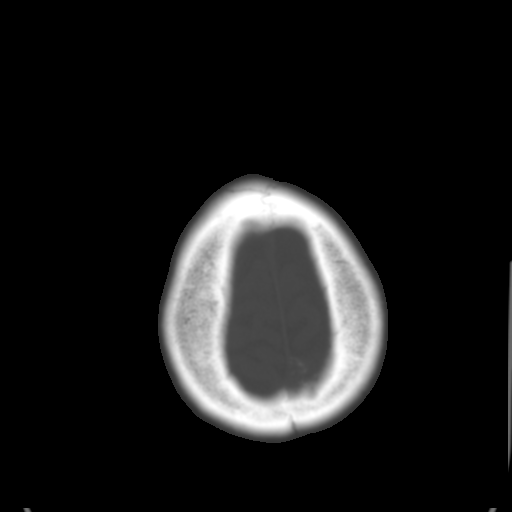
[im 29/36  brain]
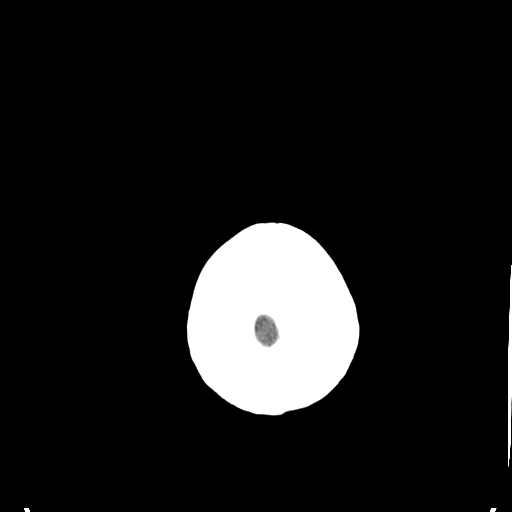
[im 32/36  brain]
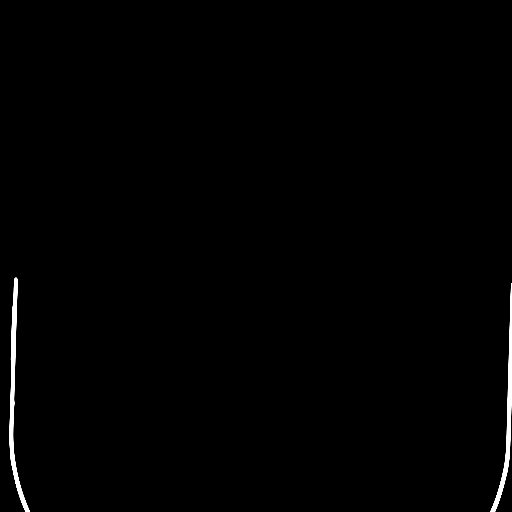
[im 34/36  brain]
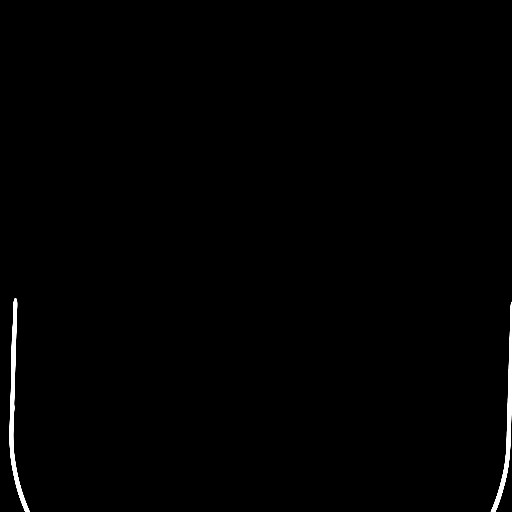

[16 of 30 positions shown; findings below may reference images not displayed]

FINDINGS: There is no evidence for acute hemorrhage, hydrocephalus,
mass lesion, or abnormal extra-axial fluid collection.  No definite
CT evidence for acute infarction.  Prominence of the sulci,
cisterns, and ventricles, in keeping with diffuse cerebral volume
loss. The visualized paranasal sinuses and mastoid air cells are
predominately clear. High attenuation opacified right frontal
ethmoid air cell may represent a polyp.  No calvarial abnormality.
IMPRESSION: Mild volume loss for age.  No acute intracranial abnormality.

## 2016-12-07 ENCOUNTER — Encounter (HOSPITAL_BASED_OUTPATIENT_CLINIC_OR_DEPARTMENT_OTHER): Payer: Self-pay

## 2016-12-07 ENCOUNTER — Emergency Department (HOSPITAL_BASED_OUTPATIENT_CLINIC_OR_DEPARTMENT_OTHER): Payer: BLUE CROSS/BLUE SHIELD

## 2016-12-07 ENCOUNTER — Emergency Department (HOSPITAL_BASED_OUTPATIENT_CLINIC_OR_DEPARTMENT_OTHER)
Admission: EM | Admit: 2016-12-07 | Discharge: 2016-12-07 | Disposition: A | Discharge status: Home / Self Care | Payer: BLUE CROSS/BLUE SHIELD | Attending: Emergency Medicine | Admitting: Emergency Medicine

## 2016-12-07 DIAGNOSIS — I10 Essential (primary) hypertension: Secondary | ICD-10-CM | POA: Diagnosis not present

## 2016-12-07 DIAGNOSIS — J449 Chronic obstructive pulmonary disease, unspecified: Secondary | ICD-10-CM | POA: Diagnosis not present

## 2016-12-07 DIAGNOSIS — R600 Localized edema: Secondary | ICD-10-CM | POA: Insufficient documentation

## 2016-12-07 DIAGNOSIS — R2232 Localized swelling, mass and lump, left upper limb: Secondary | ICD-10-CM | POA: Diagnosis present

## 2016-12-07 DIAGNOSIS — F17200 Nicotine dependence, unspecified, uncomplicated: Secondary | ICD-10-CM | POA: Diagnosis not present

## 2016-12-07 DIAGNOSIS — R609 Edema, unspecified: Secondary | ICD-10-CM

## 2016-12-07 HISTORY — DX: Chronic obstructive pulmonary disease, unspecified: J44.9

## 2016-12-07 HISTORY — DX: Essential (primary) hypertension: I10

## 2016-12-07 HISTORY — DX: Cerebral infarction, unspecified: I63.9

## 2016-12-07 NOTE — ED Notes (Signed)
Pt undressed and placed in bed. NAD at this time. 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 Denies change in sensation.  RT at bedside for assessment. Denies SOB but states she has been wheezing.

## 2016-12-07 NOTE — ED Provider Notes (Addendum)
[facility]   CSN: [id number] Arrival date & time: 12/07/16  1204     History   Chief Complaint Chief Complaint  Patient presents with  . Arm Swelling    HPI Karla Hernandez is a 56 y.o. female.  HPI complains of left arm and hand swelling onset yesterday. No injury. No shortness of breath no chest pain no other associated symptoms. Patient does not feel ill. No fever. No other associated symptoms. No treatment prior to coming here nothing makes symptoms better or worse  Past Medical History:  Diagnosis Date  . COPD (chronic obstructive pulmonary disease) (HCC)   . Hypertension   . Hypokalemia   . Stroke [facility])     There are no active problems to display for this patient.   Past Surgical History:  Procedure Laterality Date  . ABDOMINAL SURGERY    . TONSILLECTOMY      OB History    No data available       Home Medications    Prior to Admission medications   Medication Sig Start Date End Date Taking? Authorizing Provider  UNKNOWN TO PATIENT PT DOES NOT KNOW MEDS   Yes [provider]  cetirizine (ZYRTEC) 10 MG tablet Take 10 mg by mouth daily as needed.      [provider]    Family History No family history on file.  Social History Social History  Substance Use Topics  . Smoking status: Current Every Day Smoker    Packs/day: 0.00  . Smokeless tobacco: Never Used  . Alcohol use No     Allergies   Aspirin   Review of Systems Review of Systems  Constitutional: Negative.   HENT: Negative.   Respiratory: Negative.   Cardiovascular: Negative.   Gastrointestinal: Negative.   Musculoskeletal: Positive for gait problem.       Wheelchair-bound.  Skin: Negative.   Neurological:       Left upper extremity with flexion contracture  Psychiatric/Behavioral: Negative.   All other systems reviewed and are negative.    Physical Exam Updated Vital Signs BP 116/79 (BP Location: Right Arm)   Pulse 82   Temp  98.6 F (37 C) (Oral)   Resp 18   Ht 5\' 10"  (1.778 m)   Wt (!) 140.6 kg (310 lb)   SpO2 90%   BMI 44.48 kg/m   Physical Exam  Constitutional: She appears well-developed and well-nourished.  HENT:  Head: Normocephalic and atraumatic.  Eyes: Conjunctivae are normal. Pupils are equal, round, and reactive to light.  Neck: Neck supple. No tracheal deviation present. No thyromegaly present.  Cardiovascular: Normal rate and regular rhythm.   No murmur heard. Pulmonary/Chest: Effort normal and breath sounds normal.  Abdominal: Soft. Bowel sounds are normal. She exhibits no distension. There is no tenderness.  Morbidly obese  Musculoskeletal: Normal range of motion. She exhibits no edema or tenderness.  Left upper extremity with flexion contracture wrist and hand. Edematous. Not red hot warm or tender. Radial pulse 2+.  Neurological: She is alert. Coordination normal.  Skin: Skin is warm and dry. No rash noted.  Psychiatric: She has a normal mood and affect.  Nursing note and vitals reviewed.    ED Treatments / Results  Labs (all labs ordered are listed, but only abnormal results are displayed) Labs Reviewed - No data to display  EKG  EKG Interpretation None       Radiology No results found.  Procedures Procedures (including critical care time)  Medications Ordered in ED Medications - No data to display  Results for orders placed or performed during the hospital encounter of 03/24/11  MRSA PCR Screening  Result Value Ref Range   MRSA by PCR NEGATIVE NEGATIVE  Clostridium Difficile by PCR  Result Value Ref Range   Toxigenic C Difficile by pcr NEGATIVE NEGATIVE  Stool culture  Result Value Ref Range   Specimen Description STOOL    Special Requests NONE    Culture      NO SALMONELLA, SHIGELLA, CAMPYLOBACTER, OR YERSINIA ISOLATED   Report Status 03/28/2011 FINAL   Urine culture  Result Value Ref Range   Specimen Description URINE, RANDOM    Special Requests NONE     Culture  Setup Time [id number]    Colony Count 15,000 COLONIES/ML    Culture      Multiple bacterial morphotypes present, none predominant. Suggest appropriate recollection if clinically indicated.   Report Status 03/26/2011 FINAL   Differential  Result Value Ref Range   Neutrophils Relative % 64 43 - 77 %   Neutro Abs 6.6 1.7 - 7.7 K/uL   Lymphocytes Relative 28 12 - 46 %   Lymphs Abs 2.9 0.7 - 4.0 K/uL   Monocytes Relative 7 3 - 12 %   Monocytes Absolute 0.7 0.1 - 1.0 K/uL   Eosinophils Relative 1 0 - 5 %   Eosinophils Absolute 0.1 0.0 - 0.7 K/uL   Basophils Relative 0 0 - 1 %   Basophils Absolute 0.0 0.0 - 0.1 K/uL  CBC  Result Value Ref Range   WBC 10.4 4.0 - 10.5 K/uL   RBC 3.17 (L) 3.87 - 5.11 MIL/uL   Hemoglobin 9.4 (L) 12.0 - 15.0 g/dL   HCT 81.128.5 (L) 91.436.0 - 78.246.0 %   MCV 89.9 78.0 - 100.0 fL   MCH 29.7 26.0 - 34.0 pg   MCHC 33.0 30.0 - 36.0 g/dL   RDW 95.619.9 (H) 21.311.5 - 08.615.5 %   Platelets 313 150 - 400 K/uL  Hepatic function panel  Result Value Ref Range   Total Protein 5.1 (L) 6.0 - 8.3 g/dL   Albumin 1.9 (L) 3.5 - 5.2 g/dL   AST 32 0 - 37 U/L   ALT 18 0 - 35 U/L   Alkaline Phosphatase 127 (H) 39 - 117 U/L   Total Bilirubin 0.8 0.3 - 1.2 mg/dL   Bilirubin, Direct 0.4 (H) 0.0 - 0.3 mg/dL   Indirect Bilirubin 0.4 0.3 - 0.9 mg/dL  Basic metabolic panel  Result Value Ref Range   Sodium 137 135 - 145 mEq/L   Potassium 2.2 (LL) 3.5 - 5.1 mEq/L   Chloride 91 (L) 96 - 112 mEq/L   CO2 40 (HH) 19 - 32 mEq/L   Glucose, Bld 97 70 - 99 mg/dL   BUN <3 (L) 6 - 23 mg/dL   Creatinine, Ser <5.78<0.47 (L) 0.50 - 1.10 mg/dL   Calcium 7.5 (L) 8.4 - 10.5 mg/dL   GFR calc non Af Amer NOT CALCULATED >60 mL/min   GFR calc Af Amer NOT CALCULATED >60 mL/min  Lipase, blood  Result Value Ref Range   Lipase 11 11 - 59 U/L  Magnesium  Result Value Ref Range   Magnesium 1.9 1.5 - 2.5 mg/dL  Cardiac panel(cret kin+cktot+mb+tropi)  Result Value Ref Range   Total CK 165 7 - 177 U/L   CK,  MB 3.5 0.3 - 4.0 ng/mL   Troponin I <0.30 <0.30 ng/mL   Relative Index 2.1  0.0 - 2.5  Lipid panel  Result Value Ref Range   Cholesterol 100 0 - 200 mg/dL   Triglycerides 72 <098 mg/dL   HDL 34 (L) >11 mg/dL   Total CHOL/HDL Ratio 2.9 RATIO   VLDL 14 0 - 40 mg/dL   LDL Cholesterol 52 0 - 99 mg/dL  Iron and TIBC  Result Value Ref Range   Iron 40 (L) 42 - 135 ug/dL   TIBC 914 (L) 782 - 956 ug/dL   Saturation Ratios 21 20 - 55 %   UIBC 149 125 - 400 ug/dL  Hemoglobin O1H  Result Value Ref Range   Hgb A1c MFr Bld 5.2 <5.7 %   Mean Plasma Glucose 103 <117 mg/dL  TSH  Result Value Ref Range   TSH 2.041 0.350 - 4.500 uIU/mL  Folate  Result Value Ref Range   Folate 3.8 ng/mL  Vitamin B12  Result Value Ref Range   Vitamin B-12 1780 (H) 211 - 911 pg/mL  Ferritin  Result Value Ref Range   Ferritin 19 10 - 291 ng/mL  Cardiac panel(cret kin+cktot+mb+tropi)  Result Value Ref Range   Total CK 166 7 - 177 U/L   CK, MB 3.5 0.3 - 4.0 ng/mL   Troponin I <0.30 <0.30 ng/mL   Relative Index 2.1 0.0 - 2.5  Cardiac panel(cret kin+cktot+mb+tropi)  Result Value Ref Range   Total CK 184 (H) 7 - 177 U/L   CK, MB 3.6 0.3 - 4.0 ng/mL   Troponin I <0.30 <0.30 ng/mL   Relative Index 2.0 0.0 - 2.5  Occult blood x 1 card to lab, stool  Result Value Ref Range   Fecal Occult Bld POSITIVE   Basic metabolic panel  Result Value Ref Range   Sodium 142 135 - 145 mEq/L   Potassium 3.4 (L) 3.5 - 5.1 mEq/L   Chloride 105 96 - 112 mEq/L   CO2 32 19 - 32 mEq/L   Glucose, Bld 95 70 - 99 mg/dL   BUN <3 (L) 6 - 23 mg/dL   Creatinine, Ser <0.86 (L) 0.50 - 1.10 mg/dL   Calcium 7.6 (L) 8.4 - 10.5 mg/dL   GFR calc non Af Amer NOT CALCULATED >60 mL/min   GFR calc Af Amer NOT CALCULATED >60 mL/min  CBC  Result Value Ref Range   WBC 7.8 4.0 - 10.5 K/uL   RBC 2.78 (L) 3.87 - 5.11 MIL/uL   Hemoglobin 8.4 (L) 12.0 - 15.0 g/dL   HCT 57.8 (L) 46.9 - 62.9 %   MCV 92.4 78.0 - 100.0 fL   MCH 30.2 26.0 - 34.0 pg    MCHC 32.7 30.0 - 36.0 g/dL   RDW 52.8 (H) 41.3 - 24.4 %   Platelets 307 150 - 400 K/uL  Giardia/cryptosporidium screen(EIA)  Result Value Ref Range   Giardia Screen - EIA NEGATIVE    Cryptosporidium Screen (EIA) NEGATIVE    Specimen Source-GICRSC STOOL   Urinalysis, Routine w reflex microscopic  Result Value Ref Range   Color, Urine AMBER (A) YELLOW   APPearance CLOUDY (A) CLEAR   Specific Gravity, Urine 1.018 1.005 - 1.030   pH 8.0 5.0 - 8.0   Glucose, UA NEGATIVE NEGATIVE mg/dL   Hgb urine dipstick TRACE (A) NEGATIVE   Bilirubin Urine SMALL (A) NEGATIVE   Ketones, ur 15 (A) NEGATIVE mg/dL   Protein, ur NEGATIVE NEGATIVE mg/dL   Urobilinogen, UA 1.0 0.0 - 1.0 mg/dL   Nitrite POSITIVE (A) NEGATIVE   Leukocytes, UA  TRACE (A) NEGATIVE  Urine microscopic-add on  Result Value Ref Range   Squamous Epithelial / LPF MANY (A) RARE   WBC, UA 0-2 <3 WBC/hpf   RBC / HPF 0-2 <3 RBC/hpf   Bacteria, UA FEW (A) RARE   Urine-Other MUCOUS PRESENT   Glucose, capillary  Result Value Ref Range   Glucose-Capillary 117 (H) 70 - 99 mg/dL  Occult blood x 1 card to lab, stool  Result Value Ref Range   Fecal Occult Bld POSITIVE   CBC  Result Value Ref Range   WBC 7.6 4.0 - 10.5 K/uL   RBC 2.80 (L) 3.87 - 5.11 MIL/uL   Hemoglobin 8.4 (L) 12.0 - 15.0 g/dL   HCT 60.4 (L) 54.0 - 98.1 %   MCV 93.9 78.0 - 100.0 fL   MCH 30.0 26.0 - 34.0 pg   MCHC 31.9 30.0 - 36.0 g/dL   RDW 19.1 (H) 47.8 - 29.5 %   Platelets 322 150 - 400 K/uL  Comprehensive metabolic panel  Result Value Ref Range   Sodium 143 135 - 145 mEq/L   Potassium 4.5 3.5 - 5.1 mEq/L   Chloride 110 96 - 112 mEq/L   CO2 27 19 - 32 mEq/L   Glucose, Bld 82 70 - 99 mg/dL   BUN <3 (L) 6 - 23 mg/dL   Creatinine, Ser 6.21 (L) 0.50 - 1.10 mg/dL   Calcium 7.9 (L) 8.4 - 10.5 mg/dL   Total Protein 4.8 (L) 6.0 - 8.3 g/dL   Albumin 1.7 (L) 3.5 - 5.2 g/dL   AST 24 0 - 37 U/L   ALT 16 0 - 35 U/L   Alkaline Phosphatase 109 39 - 117 U/L   Total  Bilirubin 0.7 0.3 - 1.2 mg/dL   GFR calc non Af Amer >60 >60 mL/min   GFR calc Af Amer >60 >60 mL/min  Magnesium  Result Value Ref Range   Magnesium 1.8 1.5 - 2.5 mg/dL  Urine rapid drug screen (hosp performed)  Result Value Ref Range   Opiates NONE DETECTED NONE DETECTED   Cocaine NONE DETECTED NONE DETECTED   Benzodiazepines POSITIVE (A) NONE DETECTED   Amphetamines NONE DETECTED NONE DETECTED   Tetrahydrocannabinol POSITIVE (A) NONE DETECTED   Barbiturates NONE DETECTED NONE DETECTED  CBC  Result Value Ref Range   WBC 7.7 4.0 - 10.5 K/uL   RBC 2.81 (L) 3.87 - 5.11 MIL/uL   Hemoglobin 8.5 (L) 12.0 - 15.0 g/dL   HCT 30.8 (L) 65.7 - 84.6 %   MCV 92.9 78.0 - 100.0 fL   MCH 30.2 26.0 - 34.0 pg   MCHC 32.6 30.0 - 36.0 g/dL   RDW 96.2 (H) 95.2 - 84.1 %   Platelets 350 150 - 400 K/uL   US Venous Img Upper Uni Left  Result Date: 12/07/2016 CLINICAL DATA:  C/o Lt hand/forearm swelling x 2 days. 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 Smoker, obesity, takes blood thinner EXAM: LEFT UPPER EXTREMITY VENOUS DOPPLER ULTRASOUND TECHNIQUE: Gray-scale sonography with graded compression, as well as color Doppler and duplex ultrasound were performed to evaluate the upper extremity deep venous system from the level of the subclavian vein and including the jugular, axillary, basilic and upper cephalic vein. Spectral Doppler was utilized to evaluate flow at rest and with distal augmentation maneuvers. COMPARISON:  None. FINDINGS: Thrombus within deep veins:  None visualized. Compressibility of deep veins:  Normal. Duplex waveform respiratory phasicity:  Normal.  Duplex waveform response to augmentation:  Normal. Venous reflux:  None visualized. Other findings: Subcutaneous edema in the distal forearm limiting visibility of the left ulnar veins. Limited contralateral images of the right subclavian vein unremarkable. IMPRESSION: Negative for left upper extremity DVT.  Electronically Signed   By: Corlis Leak M.D.   On: 12/07/2016 14:23   Initial Impression / Assessment and Plan / ED Course  I have reviewed the triage vital signs and the nursing notes.  Pertinent labs & imaging results that were available during my care of the patient were reviewed by me and considered in my medical decision making (see chart for details).     3:20 PM patient remains asymptomatic. I repeatedly asked her she is dyspneic and she replies no. She does not appear dyspneic has normal respiratory and I believe that pulse ox is at her baseline, given her body habitus and history of COPD and smoking history. I've encouraged her to elevate her arm. Follow-up her with her PMD No evidence of DVT. Final Clinical Impressions(s) / ED Diagnoses  Diagnosis peripheral edema Final diagnoses:  None    New Prescriptions New Prescriptions   No medications on file     Doug Sou, MD 12/07/16 1523    Doug Sou, MD 12/07/16 1524

## 2016-12-07 NOTE — ED Notes (Signed)
Pt still in US

## 2016-12-07 NOTE — Discharge Instructions (Signed)
Elevate your arm above your heart as much as possible. See your primary care physician if you're not improving within the next week. Return if concern for any reason. No evidence of blood clot or infection in your arm

## 2016-12-07 NOTE — ED Notes (Addendum)
ED Provider at bedside.  Removed pt off o2 nasal cannula

## 2016-12-07 NOTE — ED Triage Notes (Signed)
C/o swelling to left hand/wrist x 2 days-denies injury-hx of partial paralysis from stroke-presents triage in own w/c-NAD

## 2016-12-07 NOTE — ED Notes (Signed)
Patient transported to Ultrasound 

## 2016-12-07 NOTE — ED Notes (Signed)
Pt d/c from department in her own wheelchair with family
